# Patient Record
Sex: Male | Born: 1955 | Race: White | Hispanic: No | Marital: Married | Smoking: Never smoker
Health system: Southern US, Community
[De-identification: ages and names within clinical notes are randomized; demographics above are authoritative.]

---

## 2003-03-25 ENCOUNTER — Encounter: Payer: Self-pay | Admitting: Unknown Physician Specialty

## 2003-03-25 ENCOUNTER — Encounter: Admission: RE | Admit: 2003-03-25 | Discharge: 2003-03-25 | Payer: Self-pay | Admitting: Unknown Physician Specialty

## 2009-10-16 ENCOUNTER — Encounter: Admission: RE | Admit: 2009-10-16 | Discharge: 2009-10-16 | Payer: Self-pay | Admitting: *Deleted

## 2010-02-09 ENCOUNTER — Encounter: Admission: RE | Admit: 2010-02-09 | Discharge: 2010-02-09 | Payer: Self-pay | Admitting: Neurosurgery

## 2010-03-06 ENCOUNTER — Encounter: Admission: RE | Admit: 2010-03-06 | Discharge: 2010-03-06 | Payer: Self-pay | Admitting: Neurosurgery

## 2010-05-06 ENCOUNTER — Encounter: Admission: RE | Admit: 2010-05-06 | Discharge: 2010-05-06 | Payer: Self-pay | Admitting: Neurosurgery

## 2010-08-05 ENCOUNTER — Encounter: Admission: RE | Admit: 2010-08-05 | Discharge: 2010-08-05 | Payer: Self-pay | Admitting: Neurosurgery

## 2011-01-15 ENCOUNTER — Other Ambulatory Visit: Payer: Self-pay | Admitting: Neurosurgery

## 2011-01-15 ENCOUNTER — Ambulatory Visit
Admission: RE | Admit: 2011-01-15 | Discharge: 2011-01-15 | Disposition: A | Discharge status: Home / Self Care | Payer: BC Managed Care – PPO | Source: Ambulatory Visit | Attending: Neurosurgery | Admitting: Neurosurgery

## 2011-01-15 DIAGNOSIS — M542 Cervicalgia: Secondary | ICD-10-CM

## 2012-01-10 IMAGING — CR DG CERVICAL SPINE 1V
1 series · 1 of 1 positions shown · non-contrast
Comparison: Cervical spine film of 08/05/2010

CLINICAL DATA: Neck pain, cervical spine surgery 1 year ago

CERVICAL SPINE - 1 VIEW

[w c-spine lat]
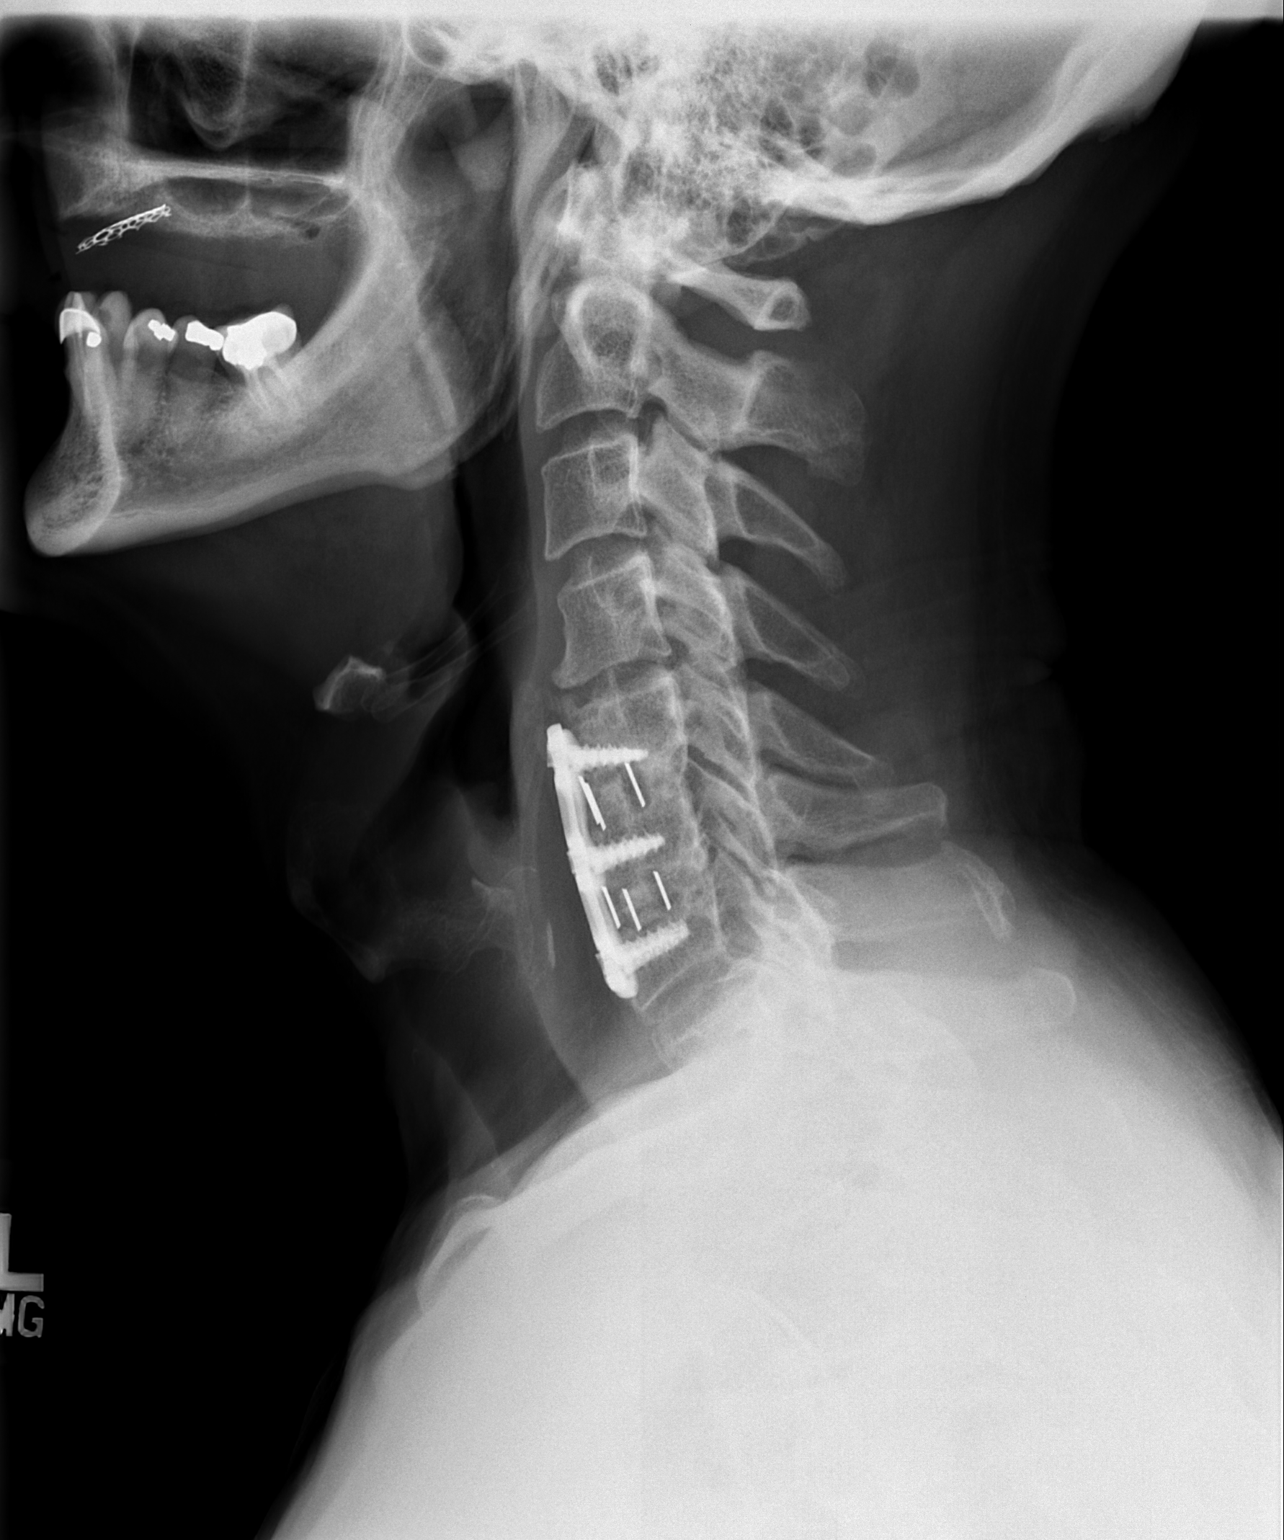

[1 of 1 positions shown; findings below may reference images not displayed]

FINDINGS: Anterior fusion from C5-C7 appears stable.  There is no
change in position of the anterior metallic fixation plate.  The
interbody fusion plugs are unchanged in position and height and
there does appear to be solid bony fusion at C5-6 and C6-7 levels
with normal alignment maintained.  No prevertebral soft tissue
swelling is seen.  There is some degenerative disc disease at the
C4-5 level.
IMPRESSION: Solid appearing fusion from C5-C7 with normal alignment.  Mild
degenerative disc disease at C4-5.

## 2015-03-13 ENCOUNTER — Ambulatory Visit (INDEPENDENT_AMBULATORY_CARE_PROVIDER_SITE_OTHER): Payer: BLUE CROSS/BLUE SHIELD

## 2015-03-13 ENCOUNTER — Encounter: Payer: Self-pay | Admitting: Podiatry

## 2015-03-13 ENCOUNTER — Ambulatory Visit (INDEPENDENT_AMBULATORY_CARE_PROVIDER_SITE_OTHER): Payer: BLUE CROSS/BLUE SHIELD | Admitting: Podiatry

## 2015-03-13 VITALS — BP 136/86 | HR 81 | Resp 18

## 2015-03-13 DIAGNOSIS — R52 Pain, unspecified: Secondary | ICD-10-CM

## 2015-03-13 DIAGNOSIS — G5762 Lesion of plantar nerve, left lower limb: Secondary | ICD-10-CM | POA: Diagnosis not present

## 2015-03-13 DIAGNOSIS — G5782 Other specified mononeuropathies of left lower limb: Secondary | ICD-10-CM

## 2015-03-15 NOTE — Progress Notes (Signed)
Subjective:     Patient ID: Jake Weaver, male   DOB: 1956-09-28, 59 y.o.   MRN: [id number]  HPI   This patient presents to the office with chief complaint of pain in his left foot for months.  He says he experiences sharp and even burning symptoms in his left foot.  He has no redness or swelling noted in his foot.  No history of trauma or injury.  He presents for evaluation and treatment.  Review of Systems     Objective:   Physical Exam  Dorsalis pedis pedis and posterior tibial pulses are palpable.  TG and CR wnl.  Semmes Weinstein monofilament wire test is normal.  Palpable pain in the third interspace left foot in the absence of redness and swelling.  He has mycotic nail third toenail left foot.     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  2.  Onychomycosis 3rd toenail left foot.     Plan:     I.E.  X-ray  Injection therapy 3rd interspace left foot.

## 2015-03-18 ENCOUNTER — Ambulatory Visit: Payer: Self-pay | Admitting: Podiatrist

## 2015-03-20 ENCOUNTER — Ambulatory Visit: Payer: BLUE CROSS/BLUE SHIELD | Admitting: Podiatry

## 2017-11-26 ENCOUNTER — Ambulatory Visit: Payer: BLUE CROSS/BLUE SHIELD | Admitting: Sports Medicine

## 2017-11-26 ENCOUNTER — Encounter: Payer: Self-pay | Admitting: Sports Medicine

## 2017-11-26 DIAGNOSIS — G5782 Other specified mononeuropathies of left lower limb: Secondary | ICD-10-CM

## 2017-11-26 DIAGNOSIS — G5762 Lesion of plantar nerve, left lower limb: Secondary | ICD-10-CM | POA: Diagnosis not present

## 2017-11-26 DIAGNOSIS — M79675 Pain in left toe(s): Secondary | ICD-10-CM

## 2017-11-26 NOTE — Progress Notes (Signed)
Subjective: Jake Weaver is a 62 y.o. male patient who presents to office for evaluation of left foot pain. Patient complains of progressive pain especially over the last several weeks reports that on yesterday the pain was really bad especially with playing golf could only play about 3 holes before he had intense burning on the left foot specifically which he thinks may be at the fourth toe however cannot really tell if it the third toe as well.  Patient states that he has not tried any treatment for this however in the past back in 2016 patient was treated in office for very similar symptoms however patient does not recall whether this treatment previously helped.  Patient states that he has tried changing up his shoes and has found that they have not offered any relief reports that the only time it feels a little bit better is when he can [place] sandals in [place].  Admits to a history of thick third toenails.  Patient denies any other pedal complaints. Denies injury/trip/fall/sprain/any other causative factors.   There are no active problems to display for this patient.   Current Outpatient Medications on File Prior to Visit  Medication Sig Dispense Refill  . cyanocobalamin (,VITAMIN B-12,) 1000 MCG/ML injection   2  . levofloxacin (LEVAQUIN) 750 MG tablet Take 750 mg by mouth daily.  0  . Multiple Vitamin (THERA) TABS Take 1 tablet by mouth.    . predniSONE (DELTASONE) 10 MG tablet   0   No current facility-administered medications on file prior to visit.     Allergies  Allergen Reactions  . Codeine Nausea And Vomiting    Objective:  General: Alert and oriented x3 in no acute distress  Dermatology: No open lesions bilateral lower extremities, no webspace macerations, no ecchymosis bilateral, all nails x 10 are well manicured however there is mild thickening at the distal aspect of the third toes bilateral consistent with possible onychomycosis.  Vascular: Dorsalis Pedis and Posterior  Tibial pedal pulses palpable, Capillary Fill Time 3 seconds,(+) pedal hair growth bilateral, no edema bilateral lower extremities, Temperature gradient within normal limits.  Neurology: Gross sensation intact via light touch bilateral, positive Mulder's clicking at the third webspace on the left foot. Subjective tingling to left fourth toe.  Musculoskeletal: Mild tenderness with palpation third intermetatarsal space on left foot, No pain with calf compression bilateral. Strength within normal limits in all groups bilateral.   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 Post-injection care discussed with patient. -Applied removable forefoot strapping and metatarsal padding patient to use daily when attempting to ambulate -Encourage patient to consider changing shoes to prevent from worsening of symptoms and to decrease or avoid activities where there is increased forefoot loading  -Patient to return to office in 4 weeks to discuss possible sclerosing injections series versus MRI to plan for excision of likely neuroma or sooner if condition worsens.  Anella Nakata  [place]ettle, DPM

## 2017-11-26 NOTE — Patient Instructions (Signed)
Morton Neuralgia Morton neuralgia is a type of foot pain in the area closest to your toes. This area is sometimes called the ball of your foot. Morton neuralgia occurs when a branch of a nerve in your foot (digital nerve) becomes compressed. When this happens over a long period of time, the nerve can thicken (neuroma) and cause pain. This usually occurs between the third and fourth toe. Morton neuralgia can come and go but may get worse over time. What are the causes? Your digital nerve can become compressed and stretched at a point where it passes under a thick band of tissue that connects your toes (intermetatarsal ligament). Morton neuralgia can be caused by mild repetitive damage in this area. This type of damage can result from:  Activities such as running or jumping.  Wearing shoes that are too tight.  What increases the risk? You may be at risk for Morton neuralgia if you:  Are male.  Wear high heels.  Wear shoes that are narrow or tight.  Participate in activities that stretch your toes. These include: ? Running. ? Ballet. ? Long-distance walking.  What are the signs or symptoms? The first symptom of Morton neuralgia is pain that spreads from the ball of your foot to your toes. It may feel like you are walking on a marble. Pain usually gets worse with walking and goes away at night. Other symptoms may include numbness and cramping of your toes. How is this diagnosed? Your health care provider will do a physical exam. When doing the exam, your health care provider may:  Squeeze your foot just behind your toe.  Ask you to move your toes to check for pain.  You may also have tests on your foot to confirm the diagnosis. These may include:  An X-ray.  An MRI.  How is this treated? Treatment for Morton neuralgia may be as simple as changing the kind of shoes you wear. Other treatments may include:  Wearing a supportive pad (orthosis) under the front of your foot. This  lifts your toe bones and takes pressure off the nerve.  Getting injections of numbing medicine and anti-inflammatory medicine (steroid) in the nerve.  Having surgery to remove part of the thickened nerve.  Follow these instructions at home:  Take medicine only as directed by your health care provider.  Wear soft-soled shoes with a wide toe area.  Stop activities that may be causing pain.  Elevate your foot when resting.  Massage your foot.  Apply ice to the injured area: ? Put ice in a plastic bag. ? Place a towel between your skin and the bag. ? Leave the ice on for 20 minutes, 2-3 times a day.  Keep all follow-up visits as directed by your health care provider. This is important. Contact a health care provider if:  Home care instructions are not helping you get better.  Your symptoms change or get worse. This information is not intended to replace advice given to you by your health care provider. Make sure you discuss any questions you have with your health care provider. Document Released: 01/04/2001 Document Revised: 03/05/2016 Document Reviewed: 11/29/2013 Elsevier Interactive Patient Education  2018 [employer]  

## 2017-12-24 ENCOUNTER — Encounter: Payer: Self-pay | Admitting: Sports Medicine

## 2017-12-24 ENCOUNTER — Ambulatory Visit: Payer: BLUE CROSS/BLUE SHIELD | Admitting: Sports Medicine

## 2017-12-24 DIAGNOSIS — M79675 Pain in left toe(s): Secondary | ICD-10-CM | POA: Diagnosis not present

## 2017-12-24 DIAGNOSIS — G5782 Other specified mononeuropathies of left lower limb: Secondary | ICD-10-CM

## 2017-12-24 DIAGNOSIS — G5762 Lesion of plantar nerve, left lower limb: Secondary | ICD-10-CM | POA: Diagnosis not present

## 2017-12-24 DIAGNOSIS — B351 Tinea unguium: Secondary | ICD-10-CM

## 2017-12-24 NOTE — Progress Notes (Signed)
Subjective: Jake Weaver is a 62 y.o. 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 Patient had cortisone injection last visit and reports that it helped for a few days however on Wednesday of this week had to wear dress shoes and was at a funeral and started having pain that was really bad.  Patient reports that the offloading pad that we made for him last visit seem to really help his foot.  Patient is also concerned about the thick third toenails and wants further evaluation and treatment since he has tried topical and oral Lamisil many years ago without any improvement.  Denies any other pedal complaints.  There are no active problems to display for this patient.   Current Outpatient Medications on File Prior to Visit  Medication Sig Dispense Refill  . cyanocobalamin (,VITAMIN B-12,) 1000 MCG/ML injection   2  . levofloxacin (LEVAQUIN) 750 MG tablet Take 750 mg by mouth daily.  0  . Multiple Vitamin (THERA) TABS Take 1 tablet by mouth.    . predniSONE (DELTASONE) 10 MG tablet   0   No current facility-administered medications on file prior to visit.     Allergies  Allergen Reactions  . Codeine Nausea And Vomiting    Objective:  General: Alert and oriented x3 in no acute distress  Dermatology: No open lesions bilateral lower extremities, no webspace macerations, no ecchymosis bilateral, all nails x 10 are well manicured however there is mild thickening at the distal aspect of the third toes bilateral consistent with possible onychomycosis.  Vascular: Dorsalis Pedis and Posterior Tibial pedal pulses palpable, Capillary Fill Time 3 seconds,(+) pedal hair growth bilateral, no edema bilateral lower extremities, Temperature gradient within normal limits.  Neurology: Gross sensation intact via light touch bilateral, positive Mulder's clicking at the third webspace on the left foot. Subjective tingling to left fourth  toe.  Musculoskeletal: Mild tenderness with palpation third intermetatarsal space on left foot, No pain with calf compression bilateral. Strength within normal limits in all groups bilateral.   Assessment and Plan: Problem List Items Addressed This Visit    None    Visit Diagnoses    Neuroma digital nerve, left    -  Primary   Relevant Orders   Culture, fungus without smear   Toe pain, left       Dermatophytosis, nail       bil 3rd toes   Relevant Orders   Culture, fungus without smear     -Complete examination performed -Re-Discussed treatement options for digital neuroma and nail fungus -After oral consent and aseptic prep, injected a mixture containing 1.5 ml of 0.5%  bupivacaine sclerosing agent at the digital nerve at the left third intermetatarsal space without complication.  This is injection #1 to site.  Post-injection care discussed with patient. -Re-applied removable forefoot strapping and metatarsal padding patient to use daily when attempting to ambulate -Encouraged patient again to consider changing shoes to prevent from worsening of symptoms and to decrease or avoid activities where there is increased forefoot loading -Fungal culture was obtained from bilateral 3rd toenails by removing a portion of the hard nail itself from each of the involved toenails using a sterile nail nipper and sent to [facility]. Patient tolerated the biopsy procedure well without discomfort or need for anesthesia.   -Patient to return to office in 2 weeks for sclerosing injections series or sooner if condition worsens.  Patient is aware that we will discuss  fungal culture results in roughly about 1 month when the results have been made available.  Asencion Islam, DPM

## 2018-01-07 ENCOUNTER — Ambulatory Visit: Payer: BLUE CROSS/BLUE SHIELD | Admitting: Sports Medicine

## 2018-01-07 ENCOUNTER — Encounter: Payer: Self-pay | Admitting: Sports Medicine

## 2018-01-07 DIAGNOSIS — G5782 Other specified mononeuropathies of left lower limb: Secondary | ICD-10-CM

## 2018-01-07 DIAGNOSIS — B351 Tinea unguium: Secondary | ICD-10-CM

## 2018-01-07 DIAGNOSIS — M79675 Pain in left toe(s): Secondary | ICD-10-CM

## 2018-01-07 DIAGNOSIS — G5762 Lesion of plantar nerve, left lower limb: Secondary | ICD-10-CM | POA: Diagnosis not present

## 2018-01-07 NOTE — Progress Notes (Signed)
Subjective: Jake Weaver is a 62 y.o. 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 Patient had alcohol sclerosing injection #1 last visit and reports that it helped and that pain is getting better.  Patient reports that the offloading pad continues to help his foot.  Patient is also here for discussion of fungal culture results.  Denies any other pedal complaints.  There are no active problems to display for this patient.   Current Outpatient Medications on File Prior to Visit  Medication Sig Dispense Refill  . cyanocobalamin (,VITAMIN B-12,) 1000 MCG/ML injection   2  . levofloxacin (LEVAQUIN) 750 MG tablet Take 750 mg by mouth daily.  0  . Multiple Vitamin (THERA) TABS Take 1 tablet by mouth.    . predniSONE (DELTASONE) 10 MG tablet   0   No current facility-administered medications on file prior to visit.     Allergies  Allergen Reactions  . Codeine Nausea And Vomiting    Objective:  General: Alert and oriented x3 in no acute distress  Dermatology: No open lesions bilateral lower extremities, no webspace macerations, no ecchymosis bilateral, all nails x 10 are well manicured however there is mild thickening at the distal aspect of the third toes bilateral consistent with possible mycosis as reported in fungal culture results below.  Vascular: Dorsalis Pedis and Posterior Tibial pedal pulses palpable, Capillary Fill Time 3 seconds,(+) pedal hair growth bilateral, no edema bilateral lower extremities, Temperature gradient within normal limits.  Neurology: Gross sensation intact via light touch bilateral, positive Mulder's clicking at the third webspace on the left foot. Subjective tingling to left fourth toe.  Musculoskeletal: Minimal tenderness with palpation third intermetatarsal space on left foot, No pain with calf compression bilateral. Strength within normal limits in all groups bilateral.   Fungal culture supportive  of yeast however underneath the microscopic exam there is some early changes of fungus noted as well  Assessment and Plan: Problem List Items Addressed This Visit    None    Visit Diagnoses    Dermatophytosis, nail    -  Primary   Relevant Orders   Hepatic Function Panel   Neuroma digital nerve, left       Toe pain, left         -Complete examination performed -Re-Discussed treatement options for digital neuroma and nail fungus -After oral consent and aseptic prep, injected a mixture containing 1.5 ml of 0.5%  bupivacaine sclerosing agent at the digital nerve at the left third intermetatarsal space without complication.  This is injection #2 to site.  Post-injection care discussed with patient. -Continue with metatarsal padding -Fungal culture results were reviewed patient to try oral itraconazole: Ordered liver function test if these tests are normal will send Rx terconazole to pharmacy for patient to start for positive culture -Encouraged good hygiene habits and to discard any old or worn shoes that may be contributing to yeast and early fungus in toenails -Patient to return to office in 2 weeks for sclerosing injections series or sooner if condition worsens.    Asencion Islamitorya Lillyan Hitson, DPM

## 2018-01-08 LAB — HEPATIC FUNCTION PANEL
ALBUMIN: 4.8 g/dL (ref 3.6–4.8)
ALT: 18 IU/L (ref 0–44)
AST: 16 IU/L (ref 0–40)
Alkaline Phosphatase: 105 IU/L (ref 39–117)
BILIRUBIN TOTAL: 0.5 mg/dL (ref 0.0–1.2)
BILIRUBIN, DIRECT: 0.15 mg/dL (ref 0.00–0.40)
TOTAL PROTEIN: 7 g/dL (ref 6.0–8.5)

## 2018-01-11 ENCOUNTER — Telehealth: Payer: Self-pay | Admitting: *Deleted

## 2018-01-11 MED ORDER — ITRACONAZOLE 200 MG PO TABS: ORAL_TABLET | ORAL | 0 refills | Status: DC

## 2018-01-11 NOTE — Telephone Encounter (Signed)
I informed pt of Dr. Stover's review of results and orders. 

## 2018-01-11 NOTE — Telephone Encounter (Signed)
-----   Message from Asencion Islamitorya Stover, [place] sent at 01/10/2018  5:19 PM EDT ----- Labs normal. Send to pharmacy Itraconazole 200mg  x 90 tabs. Take 1 tab by mouth daily.  Thanks Dr. Marylene LandStover

## 2018-01-18 ENCOUNTER — Telehealth: Payer: Self-pay | Admitting: *Deleted

## 2018-01-18 NOTE — Telephone Encounter (Signed)
Pt states the medication for the yeast is too expensive, would like an alternate.

## 2018-01-18 NOTE — Telephone Encounter (Signed)
Lets try Fluconazole 400mg  (total) once a week, give enough for 12 weeks -Dr. Marylene LandStover

## 2018-01-19 MED ORDER — FLUCONAZOLE 200 MG PO TABS: ORAL_TABLET | ORAL | 0 refills | Status: AC

## 2018-01-19 NOTE — Telephone Encounter (Signed)
I informed pt of Dr. Wynema BirchStover's fluconazole 400mg  #12 one tablet per week for 12 weeks.

## 2018-01-26 ENCOUNTER — Ambulatory Visit: Payer: BLUE CROSS/BLUE SHIELD | Admitting: Sports Medicine

## 2018-01-26 ENCOUNTER — Encounter: Payer: Self-pay | Admitting: Sports Medicine

## 2018-01-26 DIAGNOSIS — M79672 Pain in left foot: Secondary | ICD-10-CM

## 2018-01-26 DIAGNOSIS — Z79899 Other long term (current) drug therapy: Secondary | ICD-10-CM | POA: Diagnosis not present

## 2018-01-26 DIAGNOSIS — B351 Tinea unguium: Secondary | ICD-10-CM

## 2018-01-26 DIAGNOSIS — G588 Other specified mononeuropathies: Secondary | ICD-10-CM | POA: Diagnosis not present

## 2018-01-26 NOTE — Progress Notes (Signed)
Subjective: Jake Weaver is a 62 y.o. 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 Patient had alcohol sclerosing injection #2 last visit and reports that it helped and that pain is getting better and was able to go without his padding.  Patient is also here for follow-up discussion after starting fluconazole for positive fungal culture.  Patient started the medication and denies any adverse reaction, denies nausea, vomiting, fever, chills or belly pain.  Denies any other constitutional symptoms or pedal complaints.  There are no active problems to display for this patient.   Current Outpatient Medications on File Prior to Visit  Medication Sig Dispense Refill  . cyanocobalamin (,VITAMIN B-12,) 1000 MCG/ML injection   2  . fluconazole (DIFLUCAN) 200 MG tablet Take 2 tablets once a week for 12 weeks. 24 tablet 0  . levofloxacin (LEVAQUIN) 750 MG tablet Take 750 mg by mouth daily.  0  . Multiple Vitamin (THERA) TABS Take 1 tablet by mouth.    . predniSONE (DELTASONE) 10 MG tablet   0   No current facility-administered medications on file prior to visit.     Allergies  Allergen Reactions  . Codeine Nausea And Vomiting    Objective:  General: Alert and oriented x3 in no acute distress  Dermatology: No open lesions bilateral lower extremities, no webspace macerations, no ecchymosis bilateral, all nails x 10 are well manicured however there is mild thickening at the distal aspect of the third toes bilateral consistent with possible mycosis as reported in fungal culture results with no acute changes from prior has recently started on fluconazole medication.  Vascular: Dorsalis Pedis and Posterior Tibial pedal pulses palpable, Capillary Fill Time 3 seconds,(+) pedal hair growth bilateral, no edema bilateral lower extremities, Temperature gradient within normal limits.  Neurology: Gross sensation intact via light touch bilateral, positive  Mulder's clicking at the third webspace on the left foot. Subjective tingling to left fourth toe.  Musculoskeletal: Minimal tenderness with palpation third intermetatarsal space on left foot, No pain with calf compression bilateral. Strength within normal limits in all groups bilateral.    Assessment and Plan: Problem List Items Addressed This Visit    None    Visit Diagnoses    Neuroma digital nerve    -  Primary   Left foot pain       Dermatophytosis, nail         -Complete examination performed -Re-Discussed treatement options for digital neuroma and nail fungus/med check -After oral consent and aseptic prep, injected a mixture containing 1.5 ml of 0.5%  bupivacaine sclerosing agent at the digital nerve at the left third intermetatarsal space without complication.  This is injection #3 to site.  Post-injection care discussed with patient. -Continue with metatarsal padding as needed -Cont with Fluconazole 400mg  weekly for mix of nail fungus with yeast -We will repeat liver function tests and about 1 month, Last liver function tests were normal -Patient to return to office in 2 weeks for sclerosing injections series or sooner if condition worsens.    Asencion Islamitorya Carollyn Etcheverry, DPM

## 2018-02-09 ENCOUNTER — Ambulatory Visit: Payer: BLUE CROSS/BLUE SHIELD | Admitting: Sports Medicine

## 2018-02-09 ENCOUNTER — Encounter: Payer: Self-pay | Admitting: Sports Medicine

## 2018-02-09 DIAGNOSIS — M79672 Pain in left foot: Secondary | ICD-10-CM | POA: Diagnosis not present

## 2018-02-09 DIAGNOSIS — G588 Other specified mononeuropathies: Secondary | ICD-10-CM | POA: Diagnosis not present

## 2018-02-09 NOTE — Progress Notes (Signed)
Subjective: Jake Weaver is a 62 y.o. 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 Patient had alcohol sclerosing injection #3 last visit and reports that it helped and that pain is getting better and only had to wear his padding once.  Patient is also here for follow-up discussion after starting fluconazole for positive fungal culture.  Patient started the medication about 3 to 4 weeks ago and denies any adverse reaction, denies nausea, vomiting, fever, chills or belly pain.  Denies any other constitutional symptoms or pedal complaints.  There are no active problems to display for this patient.   Current Outpatient Medications on File Prior to Visit  Medication Sig Dispense Refill  . cyanocobalamin (,VITAMIN B-12,) 1000 MCG/ML injection   2  . fluconazole (DIFLUCAN) 200 MG tablet Take 2 tablets once a week for 12 weeks. 24 tablet 0  . levofloxacin (LEVAQUIN) 750 MG tablet Take 750 mg by mouth daily.  0  . Multiple Vitamin (THERA) TABS Take 1 tablet by mouth.    . predniSONE (DELTASONE) 10 MG tablet   0   No current facility-administered medications on file prior to visit.     Allergies  Allergen Reactions  . Codeine Nausea And Vomiting    Objective:  General: Alert and oriented x3 in no acute distress  Dermatology: No open lesions bilateral lower extremities, no webspace macerations, no ecchymosis bilateral, all nails x 10 are well manicured however there is mild thickening at the distal aspect of the third toes bilateral consistent with possible mycosis as reported in fungal culture results with no acute changes from prior has recently started on fluconazole medication.  Vascular: Dorsalis Pedis and Posterior Tibial pedal pulses palpable, Capillary Fill Time 3 seconds,(+) pedal hair growth bilateral, no edema bilateral lower extremities, Temperature gradient within normal limits.  Neurology: Gross sensation intact via light  touch bilateral, positive Mulder's clicking at the third webspace on the left foot. Subjective tingling to left fourth toe.  Musculoskeletal: Minimal tenderness with palpation third intermetatarsal space on left foot, No pain with calf compression bilateral. Strength within normal limits in all groups bilateral.    Assessment and Plan: Problem List Items Addressed This Visit    None    Visit Diagnoses    Neuroma digital nerve    -  Primary   Left foot pain         -Complete examination performed -Re-Discussed treatement options for digital neuroma and nail fungus/med check -After oral consent and aseptic prep, injected a mixture containing 1.5 ml of 0.5%  bupivacaine sclerosing agent at the digital nerve at the left third intermetatarsal space without complication.  This is injection #4 to site which may be the last injection if pain is resolved.  Post-injection care discussed with patient. -Advised patient to discontinue metatarsal padding and to continue with activities as normal -Cont with Fluconazole  weekly for mix of nail fungus with yeast -We will repeat liver function tests next visit -Patient to return to office in 2 weeks for possible sclerosing injection and blood work or sooner if condition worsens.    Asencion Islam, DPM

## 2018-02-23 ENCOUNTER — Ambulatory Visit (INDEPENDENT_AMBULATORY_CARE_PROVIDER_SITE_OTHER): Payer: BLUE CROSS/BLUE SHIELD | Admitting: Sports Medicine

## 2018-02-23 ENCOUNTER — Encounter: Payer: Self-pay | Admitting: Sports Medicine

## 2018-02-23 DIAGNOSIS — M79672 Pain in left foot: Secondary | ICD-10-CM | POA: Diagnosis not present

## 2018-02-23 DIAGNOSIS — Z79899 Other long term (current) drug therapy: Secondary | ICD-10-CM

## 2018-02-23 DIAGNOSIS — G588 Other specified mononeuropathies: Secondary | ICD-10-CM

## 2018-02-23 DIAGNOSIS — B351 Tinea unguium: Secondary | ICD-10-CM

## 2018-02-23 LAB — HEPATIC FUNCTION PANEL
ALBUMIN: 4.8 g/dL (ref 3.6–4.8)
ALT: 15 IU/L (ref 0–44)
AST: 15 IU/L (ref 0–40)
Alkaline Phosphatase: 109 IU/L (ref 39–117)
Bilirubin Total: 0.6 mg/dL (ref 0.0–1.2)
Bilirubin, Direct: 0.13 mg/dL (ref 0.00–0.40)
Total Protein: 6.6 g/dL (ref 6.0–8.5)

## 2018-02-23 NOTE — Progress Notes (Signed)
Subjective: Jake Weaver is a 62 y.o. 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 Patient had alcohol sclerosing injection #4 last visit and reports that it helped and that pain but did have a flare up 1 week ago where pain was 7/10. States that he did not wear his pad but thought it could of helped.  Patient is also here for follow-up discussion after starting fluconazole for positive fungal culture.  Patient has been taking fluconazole for 6-7 weeks ago and denies any adverse reaction, denies nausea, vomiting, fever, chills or belly pain.  Denies any other constitutional symptoms or pedal complaints.  There are no active problems to display for this patient.   Current Outpatient Medications on File Prior to Visit  Medication Sig Dispense Refill  . cyanocobalamin (,VITAMIN B-12,) 1000 MCG/ML injection   2  . fluconazole (DIFLUCAN) 200 MG tablet Take 2 tablets once a week for 12 weeks. 24 tablet 0  . levofloxacin (LEVAQUIN) 750 MG tablet Take 750 mg by mouth daily.  0  . Multiple Vitamin (THERA) TABS Take 1 tablet by mouth.    . predniSONE (DELTASONE) 10 MG tablet   0   No current facility-administered medications on file prior to visit.     Allergies  Allergen Reactions  . Codeine Nausea And Vomiting    Objective:  General: Alert and oriented x3 in no acute distress  Dermatology: No open lesions bilateral lower extremities, no webspace macerations, no ecchymosis bilateral, all nails x 10 are well manicured however there is mild thickening at the distal aspect of the third toes bilateral consistent with possible mycosis as reported in fungal culture results with no acute changes from prior has recently started on fluconazole medication.  Vascular: Dorsalis Pedis and Posterior Tibial pedal pulses palpable, Capillary Fill Time 3 seconds,(+) pedal hair growth bilateral, no edema bilateral lower extremities, Temperature gradient  within normal limits.  Neurology: Gross sensation intact via light touch bilateral, positive Mulder's clicking at the third webspace on the left foot. Decreased tingling to left fourth toe.  Musculoskeletal: Minimal tenderness with palpation third intermetatarsal space on left foot, No pain with calf compression bilateral. Strength within normal limits in all groups bilateral.    Assessment and Plan: Problem List Items Addressed This Visit    None    Visit Diagnoses    Neuroma digital nerve    -  Primary   Left foot pain       Dermatophytosis, nail       Relevant Orders   Hepatic Function Panel   Encounter for long-term current use of high risk medication       Relevant Orders   Hepatic Function Panel     -Complete examination performed -Re-Discussed treatement options for digital neuroma and nail fungus on Fluconazole -After oral consent and aseptic prep, injected a mixture containing 1.5 ml of 0.5%  bupivacaine sclerosing agent at the digital nerve at the left third intermetatarsal space without complication.  This is injection #5 to site. Post-injection care discussed with patient. -Advised patient to resume metatarsal padding x1 week and to continue with activities as normal -Cont with Fluconazole  weekly for mix of nail fungus with yeast -Updated LFTs ordered and will review results with patient next visit -Patient to return to office in 2 weeks for possible sclerosing injection #6 and for review of blood work or sooner if condition worsens.    Asencion Islam, DPM

## 2018-03-09 ENCOUNTER — Ambulatory Visit (INDEPENDENT_AMBULATORY_CARE_PROVIDER_SITE_OTHER): Payer: BLUE CROSS/BLUE SHIELD | Admitting: Sports Medicine

## 2018-03-09 ENCOUNTER — Encounter: Payer: Self-pay | Admitting: Sports Medicine

## 2018-03-09 DIAGNOSIS — G588 Other specified mononeuropathies: Secondary | ICD-10-CM

## 2018-03-09 DIAGNOSIS — M79675 Pain in left toe(s): Secondary | ICD-10-CM

## 2018-03-09 DIAGNOSIS — M79672 Pain in left foot: Secondary | ICD-10-CM

## 2018-03-09 DIAGNOSIS — B351 Tinea unguium: Secondary | ICD-10-CM

## 2018-03-09 DIAGNOSIS — Z79899 Other long term (current) drug therapy: Secondary | ICD-10-CM

## 2018-03-09 NOTE — Progress Notes (Signed)
Subjective: Jake Weaver is a 62 y.o. 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 Patient had alcohol sclerosing injection #5 last visit and reports that he has been doing good with no pain and has been able to play golf with no issues.   Patient is also here for follow-up discussion after starting fluconazole for positive fungal culture and for review of blood work.  Patient has been taking fluconazole for 10-11 weeks and denies any adverse reaction, denies nausea, vomiting, fever, chills or belly pain.  Denies any other constitutional symptoms or pedal complaints.  There are no active problems to display for this patient.   Current Outpatient Medications on File Prior to Visit  Medication Sig Dispense Refill  . cyanocobalamin (,VITAMIN B-12,) 1000 MCG/ML injection   2  . fluconazole (DIFLUCAN) 200 MG tablet Take 2 tablets once a week for 12 weeks. 24 tablet 0  . levofloxacin (LEVAQUIN) 750 MG tablet Take 750 mg by mouth daily.  0  . Multiple Vitamin (THERA) TABS Take 1 tablet by mouth.    . predniSONE (DELTASONE) 10 MG tablet   0   No current facility-administered medications on file prior to visit.     Allergies  Allergen Reactions  . Codeine Nausea And Vomiting    Objective:  General: Alert and oriented x3 in no acute distress  Dermatology: No open lesions bilateral lower extremities, no webspace macerations, no ecchymosis bilateral, all nails x 10 are well manicured however there is mild thickening at the distal aspect of the third toes bilateral consistent with possible mycosis as reported in fungal culture results with no acute changes from prior has recently started on fluconazole medication.  Vascular: Dorsalis Pedis and Posterior Tibial pedal pulses palpable, Capillary Fill Time 3 seconds,(+) pedal hair growth bilateral, no edema bilateral lower extremities, Temperature gradient within normal limits.  Neurology:  Gross sensation intact via light touch bilateral, resolved Mulder's clicking at the third webspace on the left foot.  Musculoskeletal: Minimal tenderness with palpation third intermetatarsal space on left foot, No pain with calf compression bilateral. Strength within normal limits in all groups bilateral.    Assessment and Plan: Problem List Items Addressed This Visit    None    Visit Diagnoses    Neuroma digital nerve    -  Primary   Left foot pain       Dermatophytosis, nail       Encounter for long-term current use of high risk medication       Toe pain, left         -Complete examination performed -Re-Discussed treatement options for digital neuroma and nail fungus on Fluconazole -After oral consent and aseptic prep, injected a mixture containing 1.5 ml of 0.5%  bupivacaine sclerosing agent at the digital nerve at the left third intermetatarsal space without complication.  This is injection #6 to site. Post-injection care discussed with patient. -May resume activities as normal and advised patient if there is any pain or bruising to ice at the dorsal surface of his left foot -Liver function tests reviewed and are within normal limits -Cont with Fluconazole  weekly for mix of nail fungus with yeast until completed -Patient to return to office in 4 weeks for final neuroma follow-up and med check or sooner if condition worsens.    Asencion Islam, DPM

## 2018-04-13 ENCOUNTER — Ambulatory Visit: Payer: BLUE CROSS/BLUE SHIELD

## 2018-04-13 ENCOUNTER — Ambulatory Visit: Payer: BLUE CROSS/BLUE SHIELD | Admitting: Sports Medicine

## 2018-04-13 ENCOUNTER — Encounter: Payer: Self-pay | Admitting: Sports Medicine

## 2018-04-13 DIAGNOSIS — G588 Other specified mononeuropathies: Secondary | ICD-10-CM

## 2018-04-13 DIAGNOSIS — Z79899 Other long term (current) drug therapy: Secondary | ICD-10-CM

## 2018-04-13 DIAGNOSIS — M79675 Pain in left toe(s): Secondary | ICD-10-CM | POA: Diagnosis not present

## 2018-04-13 DIAGNOSIS — B351 Tinea unguium: Secondary | ICD-10-CM | POA: Diagnosis not present

## 2018-04-13 DIAGNOSIS — M79672 Pain in left foot: Secondary | ICD-10-CM | POA: Diagnosis not present

## 2018-04-13 NOTE — Progress Notes (Signed)
Subjective: Jake Weaver is a 62 y.o. 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 Patient had alcohol sclerosing injection #6 last visit and reports that he has been doing good and reports that pain is much improved.  Patient states that he only had one episode of some soreness to the area [place] but otherwise denies any symptoms.  He denies swelling, redness, bruising, warmth, or pain to the injection site area on the left foot.  Patient is also here for follow-up discussion after starting fluconazole for positive fungal culture and for review of blood work.  Patient has completed fluconazole for a 12-week course and denies any adverse reaction. Denies any other constitutional symptoms or pedal complaints.  There are no active problems to display for this patient.   Current Outpatient Medications on File Prior to Visit  Medication Sig Dispense Refill  . cyanocobalamin (,VITAMIN B-12,) 1000 MCG/ML injection   2  . fluconazole (DIFLUCAN) 200 MG tablet Take 2 tablets once a week for 12 weeks. 24 tablet 0  . levofloxacin (LEVAQUIN) 750 MG tablet Take 750 mg by mouth daily.  0  . Multiple Vitamin (THERA) TABS Take 1 tablet by mouth.    . predniSONE (DELTASONE) 10 MG tablet   0   No current facility-administered medications on file prior to visit.     Allergies  Allergen Reactions  . Codeine Nausea And Vomiting    Objective:  General: Alert and oriented x3 in no acute distress  Dermatology: No open lesions bilateral lower extremities, no webspace macerations, no ecchymosis bilateral, all nails x 10 are well manicured however there is mild thickening at the distal aspect of the third toes bilateral with proximal clearance noted.  Vascular: Dorsalis Pedis and Posterior Tibial pedal pulses palpable, Capillary Fill Time 3 seconds,(+) pedal hair growth bilateral, no edema bilateral lower extremities, Temperature  gradient within normal limits.  Neurology: Gross sensation intact via light touch bilateral, resolved Mulder's clicking at the third webspace on the left foot.  Musculoskeletal: No significant reproducible tenderness with palpation third intermetatarsal space on left foot, No pain with calf compression bilateral. Strength within normal limits in all groups bilateral.    Assessment and Plan: Problem List Items Addressed This Visit    None    Visit Diagnoses    Neuroma digital nerve    -  Primary   Left foot pain       Dermatophytosis, nail       Encounter for long-term current use of high risk medication       Toe pain, left         -Complete examination performed -Re-Discussed long-term care for digital neuroma and nails -Advised patient to continue to monitor symptoms if there is pain in the ball of the left foot to use over-the-counter insoles and good supportive shoes -Advised good hygiene habits to prevent re-infection of nail yeast -Patient to return to office as needed or sooner if condition worsens.    Asencion Islamitorya Ashika Apuzzo, DPM

## 2018-12-01 ENCOUNTER — Encounter: Payer: Self-pay | Admitting: Sports Medicine

## 2018-12-01 ENCOUNTER — Ambulatory Visit: Payer: BLUE CROSS/BLUE SHIELD | Admitting: Sports Medicine

## 2018-12-01 DIAGNOSIS — G588 Other specified mononeuropathies: Secondary | ICD-10-CM

## 2018-12-01 DIAGNOSIS — M79675 Pain in left toe(s): Secondary | ICD-10-CM

## 2018-12-01 MED ORDER — TRIAMCINOLONE ACETONIDE 10 MG/ML IJ SUSP: 10.0000 mg | Freq: Once | INTRAMUSCULAR | Status: AC

## 2018-12-01 NOTE — Addendum Note (Signed)
Addended by: Dinah Beers on: 12/01/2018 02:56 PM   Modules accepted: Orders

## 2018-12-01 NOTE — Progress Notes (Signed)
Subjective: Jake Weaver is a 63 y.o. male patient who returns to office for follow-up evaluation of left foot pain.  Patient reports that there is numbness and burning in between the fourth and fifth toes now on the left foot that started roughly about 4 to 5 weeks ago states that he also has pain on the ball of the foot in the area also gets numb especially at night.  Patient denies any changes with medical history since last visit.  No other issues noted.  There are no active problems to display for this patient.   Current Outpatient Medications on File Prior to Visit  Medication Sig Dispense Refill  . cyanocobalamin (,VITAMIN B-12,) 1000 MCG/ML injection   2  . fluconazole (DIFLUCAN) 200 MG tablet Take 2 tablets once a week for 12 weeks. 24 tablet 0  . levofloxacin (LEVAQUIN) 750 MG tablet Take 750 mg by mouth daily.  0  . Multiple Vitamin (THERA) TABS Take 1 tablet by mouth.    . triazolam (HALCION) 0.25 MG tablet      No current facility-administered medications on file prior to visit.     Allergies  Allergen Reactions  . Codeine Nausea And Vomiting    Objective:  General: Alert and oriented x3 in no acute distress  Dermatology: No open lesions bilateral lower extremities, no webspace macerations, no ecchymosis bilateral, all nails x 10 are well manicured however there is mild thickening at the distal aspect of the third toes bilateral with proximal clearance noted like before.  Vascular: Dorsalis Pedis and Posterior Tibial pedal pulses palpable, Capillary Fill Time 3 seconds,(+) pedal hair growth bilateral, no edema bilateral lower extremities, Temperature gradient within normal limits.  Neurology: Gross sensation intact via light touch bilateral, resolved Mulder's clicking at the third not the fourth webspace on the left foot.  Musculoskeletal:+ Pain and tenderness with palpation 4th intermetatarsal space on left foot, No pain with calf compression bilateral. Strength  within normal limits in all groups bilateral.    Assessment and Plan: Problem List Items Addressed This Visit    None    Visit Diagnoses    Neuroma digital nerve    -  Primary   Relevant Medications   triamcinolone acetonide (KENALOG) 10 MG/ML injection 10 mg (Start on 12/01/2018  2:30 PM)   Other Relevant Orders   DG Foot Complete Left   Toe pain, left       Relevant Medications   triamcinolone acetonide (KENALOG) 10 MG/ML injection 10 mg (Start on 12/01/2018  2:30 PM)   Other Relevant Orders   DG Foot Complete Left     -Complete examination performed -Discussed treatment options for likely recurrent neuroma now in fourth webspace on left foot  -After oral consent and aseptic prep, injected a mixture containing 1 ml of 2%  plain lidocaine, 1 ml 0.5% plain marcaine, 0.5 ml of kenalog 10 and 0.5 ml of dexamethasone phosphate into left fourth webspace without complication. Post-injection care discussed with patient.  -Dispensed metatarsal padding -Patient to return to office if no better after 1 month or sooner if condition worsens.    Asencion Islam, DPM

## 2020-03-22 DIAGNOSIS — M7918 Myalgia, other site: Secondary | ICD-10-CM | POA: Diagnosis not present

## 2020-03-22 DIAGNOSIS — M7711 Lateral epicondylitis, right elbow: Secondary | ICD-10-CM | POA: Diagnosis not present

## 2020-06-21 DIAGNOSIS — M7711 Lateral epicondylitis, right elbow: Secondary | ICD-10-CM | POA: Diagnosis not present

## 2020-06-21 DIAGNOSIS — M7918 Myalgia, other site: Secondary | ICD-10-CM | POA: Diagnosis not present

## 2020-06-21 DIAGNOSIS — Z9181 History of falling: Secondary | ICD-10-CM | POA: Diagnosis not present

## 2020-06-22 DIAGNOSIS — M7711 Lateral epicondylitis, right elbow: Secondary | ICD-10-CM | POA: Diagnosis not present

## 2020-06-22 DIAGNOSIS — M159 Polyosteoarthritis, unspecified: Secondary | ICD-10-CM | POA: Diagnosis not present

## 2020-06-28 DIAGNOSIS — M159 Polyosteoarthritis, unspecified: Secondary | ICD-10-CM | POA: Diagnosis not present

## 2020-06-28 DIAGNOSIS — M7711 Lateral epicondylitis, right elbow: Secondary | ICD-10-CM | POA: Diagnosis not present

## 2020-09-09 DIAGNOSIS — Z23 Encounter for immunization: Secondary | ICD-10-CM | POA: Diagnosis not present

## 2020-09-09 DIAGNOSIS — Z0000 Encounter for general adult medical examination without abnormal findings: Secondary | ICD-10-CM | POA: Diagnosis not present

## 2020-09-09 DIAGNOSIS — A048 Other specified bacterial intestinal infections: Secondary | ICD-10-CM | POA: Diagnosis not present

## 2020-10-01 DIAGNOSIS — L720 Epidermal cyst: Secondary | ICD-10-CM | POA: Diagnosis not present

## 2020-10-01 DIAGNOSIS — L738 Other specified follicular disorders: Secondary | ICD-10-CM | POA: Diagnosis not present

## 2020-12-16 DIAGNOSIS — Z9181 History of falling: Secondary | ICD-10-CM | POA: Diagnosis not present

## 2020-12-16 DIAGNOSIS — E785 Hyperlipidemia, unspecified: Secondary | ICD-10-CM | POA: Diagnosis not present

## 2020-12-16 DIAGNOSIS — Z0000 Encounter for general adult medical examination without abnormal findings: Secondary | ICD-10-CM | POA: Diagnosis not present

## 2021-09-13 DIAGNOSIS — Z0000 Encounter for general adult medical examination without abnormal findings: Secondary | ICD-10-CM | POA: Diagnosis not present

## 2022-01-21 DIAGNOSIS — Z139 Encounter for screening, unspecified: Secondary | ICD-10-CM | POA: Diagnosis not present

## 2022-01-21 DIAGNOSIS — Z9181 History of falling: Secondary | ICD-10-CM | POA: Diagnosis not present

## 2022-01-21 DIAGNOSIS — Z0000 Encounter for general adult medical examination without abnormal findings: Secondary | ICD-10-CM | POA: Diagnosis not present

## 2022-08-19 DIAGNOSIS — H6122 Impacted cerumen, left ear: Secondary | ICD-10-CM | POA: Diagnosis not present

## 2022-08-19 DIAGNOSIS — H8113 Benign paroxysmal vertigo, bilateral: Secondary | ICD-10-CM | POA: Diagnosis not present

## 2022-09-19 DIAGNOSIS — Z0000 Encounter for general adult medical examination without abnormal findings: Secondary | ICD-10-CM | POA: Diagnosis not present

## 2022-09-19 DIAGNOSIS — Z23 Encounter for immunization: Secondary | ICD-10-CM | POA: Diagnosis not present

## 2023-07-19 DIAGNOSIS — Z0000 Encounter for general adult medical examination without abnormal findings: Secondary | ICD-10-CM | POA: Diagnosis not present

## 2023-07-19 DIAGNOSIS — Z9181 History of falling: Secondary | ICD-10-CM | POA: Diagnosis not present

## 2023-10-05 DIAGNOSIS — D510 Vitamin B12 deficiency anemia due to intrinsic factor deficiency: Secondary | ICD-10-CM | POA: Diagnosis not present

## 2023-10-05 DIAGNOSIS — Z23 Encounter for immunization: Secondary | ICD-10-CM | POA: Diagnosis not present

## 2023-10-05 DIAGNOSIS — E785 Hyperlipidemia, unspecified: Secondary | ICD-10-CM | POA: Diagnosis not present

## 2023-10-05 DIAGNOSIS — Z0000 Encounter for general adult medical examination without abnormal findings: Secondary | ICD-10-CM | POA: Diagnosis not present

## 2023-11-16 DIAGNOSIS — M5412 Radiculopathy, cervical region: Secondary | ICD-10-CM | POA: Diagnosis not present

## 2023-11-16 DIAGNOSIS — M25511 Pain in right shoulder: Secondary | ICD-10-CM | POA: Diagnosis not present

## 2023-11-16 DIAGNOSIS — M47812 Spondylosis without myelopathy or radiculopathy, cervical region: Secondary | ICD-10-CM | POA: Diagnosis not present

## 2023-11-16 DIAGNOSIS — M19011 Primary osteoarthritis, right shoulder: Secondary | ICD-10-CM | POA: Diagnosis not present

## 2023-11-16 DIAGNOSIS — D510 Vitamin B12 deficiency anemia due to intrinsic factor deficiency: Secondary | ICD-10-CM | POA: Diagnosis not present

## 2023-12-07 DIAGNOSIS — M4802 Spinal stenosis, cervical region: Secondary | ICD-10-CM | POA: Diagnosis not present

## 2023-12-07 DIAGNOSIS — Z981 Arthrodesis status: Secondary | ICD-10-CM | POA: Diagnosis not present

## 2023-12-07 DIAGNOSIS — M4722 Other spondylosis with radiculopathy, cervical region: Secondary | ICD-10-CM | POA: Diagnosis not present

## 2023-12-07 DIAGNOSIS — M5412 Radiculopathy, cervical region: Secondary | ICD-10-CM | POA: Diagnosis not present

## 2023-12-09 DIAGNOSIS — M5412 Radiculopathy, cervical region: Secondary | ICD-10-CM | POA: Diagnosis not present

## 2023-12-13 DIAGNOSIS — E559 Vitamin D deficiency, unspecified: Secondary | ICD-10-CM | POA: Diagnosis not present

## 2023-12-13 DIAGNOSIS — M79603 Pain in arm, unspecified: Secondary | ICD-10-CM | POA: Diagnosis not present

## 2023-12-13 DIAGNOSIS — Z01818 Encounter for other preprocedural examination: Secondary | ICD-10-CM | POA: Diagnosis not present

## 2023-12-13 DIAGNOSIS — Z79899 Other long term (current) drug therapy: Secondary | ICD-10-CM | POA: Diagnosis not present

## 2024-01-07 DIAGNOSIS — M542 Cervicalgia: Secondary | ICD-10-CM | POA: Diagnosis not present

## 2024-01-07 DIAGNOSIS — M6281 Muscle weakness (generalized): Secondary | ICD-10-CM | POA: Diagnosis not present

## 2024-01-13 DIAGNOSIS — Z8616 Personal history of COVID-19: Secondary | ICD-10-CM | POA: Diagnosis not present

## 2024-01-13 DIAGNOSIS — M62838 Other muscle spasm: Secondary | ICD-10-CM | POA: Diagnosis not present

## 2024-01-13 DIAGNOSIS — D519 Vitamin B12 deficiency anemia, unspecified: Secondary | ICD-10-CM | POA: Diagnosis not present

## 2024-01-13 DIAGNOSIS — M4800 Spinal stenosis, site unspecified: Secondary | ICD-10-CM | POA: Diagnosis not present

## 2024-01-13 DIAGNOSIS — M1990 Unspecified osteoarthritis, unspecified site: Secondary | ICD-10-CM | POA: Diagnosis not present

## 2024-01-13 DIAGNOSIS — Z823 Family history of stroke: Secondary | ICD-10-CM | POA: Diagnosis not present

## 2024-01-13 DIAGNOSIS — M792 Neuralgia and neuritis, unspecified: Secondary | ICD-10-CM | POA: Diagnosis not present

## 2024-01-13 DIAGNOSIS — Z87891 Personal history of nicotine dependence: Secondary | ICD-10-CM | POA: Diagnosis not present

## 2024-01-13 DIAGNOSIS — Z791 Long term (current) use of non-steroidal anti-inflammatories (NSAID): Secondary | ICD-10-CM | POA: Diagnosis not present

## 2024-01-13 DIAGNOSIS — M542 Cervicalgia: Secondary | ICD-10-CM | POA: Diagnosis not present

## 2024-01-13 DIAGNOSIS — M6281 Muscle weakness (generalized): Secondary | ICD-10-CM | POA: Diagnosis not present

## 2024-01-20 DIAGNOSIS — M5412 Radiculopathy, cervical region: Secondary | ICD-10-CM | POA: Diagnosis not present

## 2024-01-20 DIAGNOSIS — M542 Cervicalgia: Secondary | ICD-10-CM | POA: Diagnosis not present

## 2024-01-20 DIAGNOSIS — M6281 Muscle weakness (generalized): Secondary | ICD-10-CM | POA: Diagnosis not present

## 2024-02-15 DIAGNOSIS — M6281 Muscle weakness (generalized): Secondary | ICD-10-CM | POA: Diagnosis not present

## 2024-02-15 DIAGNOSIS — M542 Cervicalgia: Secondary | ICD-10-CM | POA: Diagnosis not present

## 2024-02-17 DIAGNOSIS — M6281 Muscle weakness (generalized): Secondary | ICD-10-CM | POA: Diagnosis not present

## 2024-02-17 DIAGNOSIS — M542 Cervicalgia: Secondary | ICD-10-CM | POA: Diagnosis not present

## 2024-02-22 DIAGNOSIS — M5412 Radiculopathy, cervical region: Secondary | ICD-10-CM | POA: Diagnosis not present

## 2024-04-04 DIAGNOSIS — R52 Pain, unspecified: Secondary | ICD-10-CM | POA: Diagnosis not present

## 2024-04-04 DIAGNOSIS — Z79899 Other long term (current) drug therapy: Secondary | ICD-10-CM | POA: Diagnosis not present

## 2024-04-04 DIAGNOSIS — M5412 Radiculopathy, cervical region: Secondary | ICD-10-CM | POA: Diagnosis not present

## 2024-04-04 DIAGNOSIS — M79609 Pain in unspecified limb: Secondary | ICD-10-CM | POA: Diagnosis not present

## 2024-04-05 DIAGNOSIS — M50121 Cervical disc disorder at C4-C5 level with radiculopathy: Secondary | ICD-10-CM | POA: Diagnosis not present

## 2024-04-05 DIAGNOSIS — M50321 Other cervical disc degeneration at C4-C5 level: Secondary | ICD-10-CM | POA: Diagnosis not present

## 2024-04-05 DIAGNOSIS — M5011 Cervical disc disorder with radiculopathy,  high cervical region: Secondary | ICD-10-CM | POA: Diagnosis not present

## 2024-04-05 DIAGNOSIS — M4722 Other spondylosis with radiculopathy, cervical region: Secondary | ICD-10-CM | POA: Diagnosis not present

## 2024-04-05 DIAGNOSIS — M5412 Radiculopathy, cervical region: Secondary | ICD-10-CM | POA: Diagnosis not present

## 2024-04-06 DIAGNOSIS — M50321 Other cervical disc degeneration at C4-C5 level: Secondary | ICD-10-CM | POA: Diagnosis not present

## 2024-06-01 DIAGNOSIS — M5412 Radiculopathy, cervical region: Secondary | ICD-10-CM | POA: Diagnosis not present

## 2024-06-01 DIAGNOSIS — Z981 Arthrodesis status: Secondary | ICD-10-CM | POA: Diagnosis not present

## 2024-06-20 DIAGNOSIS — H43811 Vitreous degeneration, right eye: Secondary | ICD-10-CM | POA: Diagnosis not present

## 2024-06-20 DIAGNOSIS — H5203 Hypermetropia, bilateral: Secondary | ICD-10-CM | POA: Diagnosis not present

## 2024-06-20 DIAGNOSIS — D3132 Benign neoplasm of left choroid: Secondary | ICD-10-CM | POA: Diagnosis not present

## 2024-06-29 DIAGNOSIS — M542 Cervicalgia: Secondary | ICD-10-CM | POA: Diagnosis not present

## 2024-07-24 ENCOUNTER — Other Ambulatory Visit: Payer: Self-pay | Admitting: Neurosurgery

## 2024-07-24 DIAGNOSIS — M542 Cervicalgia: Secondary | ICD-10-CM

## 2024-07-25 ENCOUNTER — Encounter: Payer: Self-pay | Admitting: Neurosurgery

## 2024-07-27 ENCOUNTER — Ambulatory Visit
Admission: RE | Admit: 2024-07-27 | Discharge: 2024-07-27 | Disposition: A | Discharge status: Home / Self Care | Source: Ambulatory Visit | Attending: Neurosurgery | Admitting: Neurosurgery

## 2024-07-27 DIAGNOSIS — M4723 Other spondylosis with radiculopathy, cervicothoracic region: Secondary | ICD-10-CM | POA: Diagnosis not present

## 2024-07-27 DIAGNOSIS — M542 Cervicalgia: Secondary | ICD-10-CM

## 2024-08-03 DIAGNOSIS — M542 Cervicalgia: Secondary | ICD-10-CM | POA: Diagnosis not present

## 2024-08-03 DIAGNOSIS — Z981 Arthrodesis status: Secondary | ICD-10-CM | POA: Diagnosis not present

## 2024-08-03 DIAGNOSIS — M5412 Radiculopathy, cervical region: Secondary | ICD-10-CM | POA: Diagnosis not present

## 2024-09-12 DIAGNOSIS — M542 Cervicalgia: Secondary | ICD-10-CM | POA: Diagnosis not present

## 2024-09-26 DIAGNOSIS — M542 Cervicalgia: Secondary | ICD-10-CM | POA: Diagnosis not present
